# Patient Record
Sex: Female | Born: 2000 | Hispanic: Yes | Marital: Single | Smoking: Never smoker
Health system: Southern US, Community
[De-identification: ages and names within clinical notes are randomized; demographics above are authoritative.]

## PROBLEM LIST (undated history)

## (undated) DIAGNOSIS — Z789 Other specified health status: Secondary | ICD-10-CM

## (undated) HISTORY — PX: WISDOM TOOTH EXTRACTION: SHX21

---
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
## 2018-10-15 IMAGING — DX DG FINGER LITTLE 2+V*L*
3 series · 3 of 3 positions shown · non-contrast
Comparison: None.

CLINICAL DATA: Pain after fall.

EXAM:
LEFT LITTLE FINGER 2+V

[finger ap]
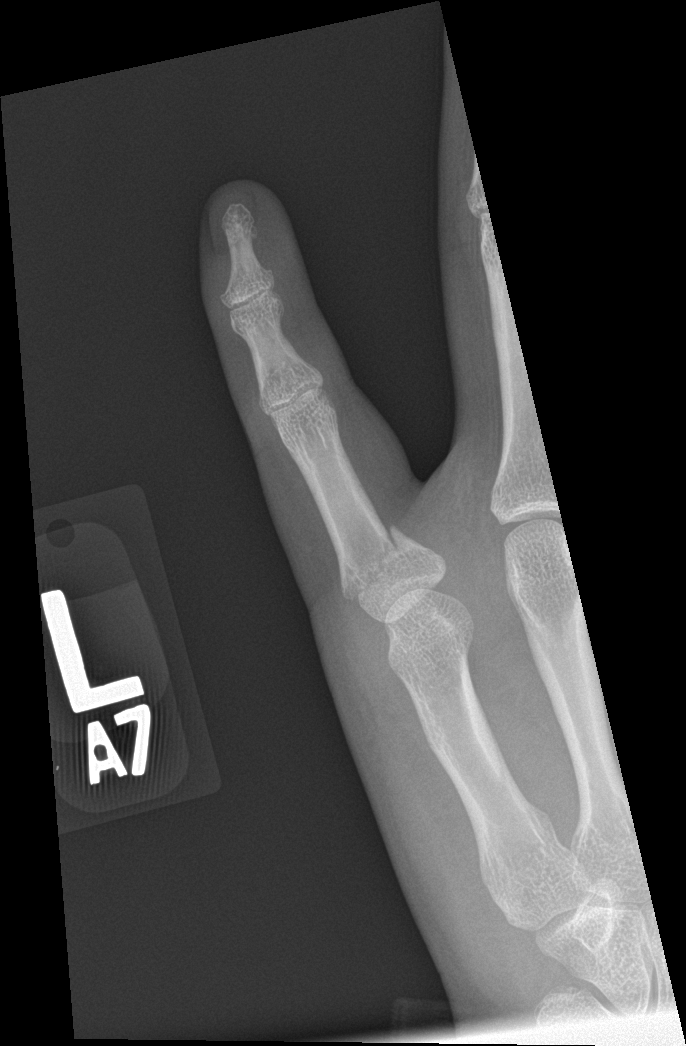

[finger obl]
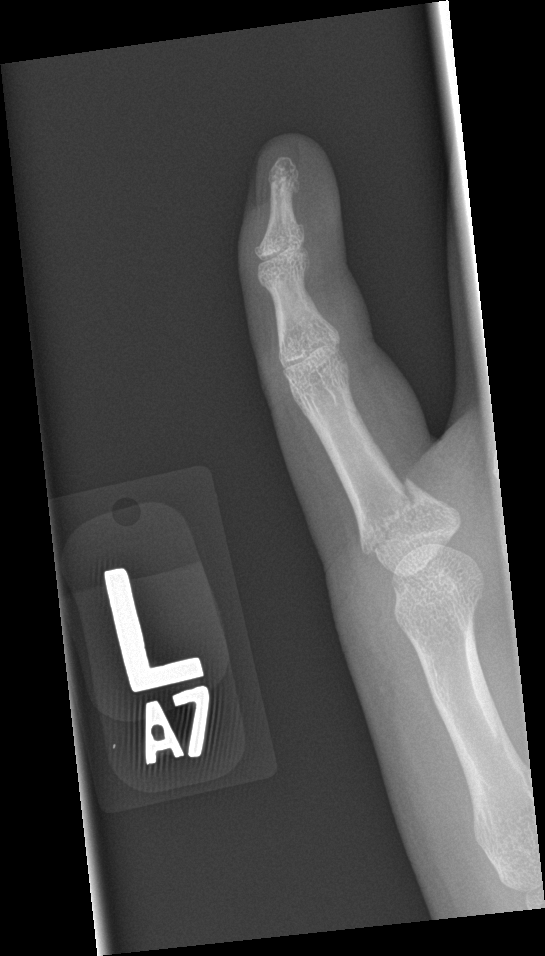

[finger lat]
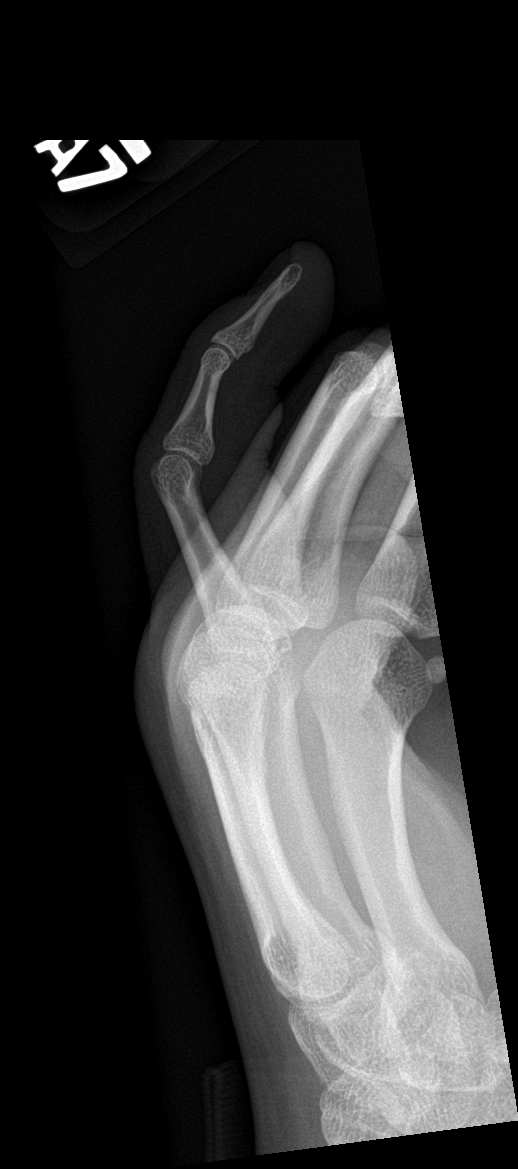

[3 of 3 positions shown; findings below may reference images not displayed]

FINDINGS: There is a fracture through the proximal aspect of the fifth
proximal phalanx with mild displacement.
IMPRESSION: Mild displaced fracture through the proximal aspect of the fifth
proximal phalanx.

## 2023-01-25 DIAGNOSIS — S8991XA Unspecified injury of right lower leg, initial encounter: Secondary | ICD-10-CM | POA: Diagnosis not present

## 2023-01-28 DIAGNOSIS — S83241A Other tear of medial meniscus, current injury, right knee, initial encounter: Secondary | ICD-10-CM | POA: Diagnosis not present

## 2023-01-31 ENCOUNTER — Other Ambulatory Visit: Payer: Self-pay | Admitting: Orthopedic Surgery

## 2023-01-31 DIAGNOSIS — S83241A Other tear of medial meniscus, current injury, right knee, initial encounter: Secondary | ICD-10-CM

## 2023-02-01 ENCOUNTER — Other Ambulatory Visit: Payer: Self-pay | Admitting: Orthopedic Surgery

## 2023-02-01 DIAGNOSIS — S83241A Other tear of medial meniscus, current injury, right knee, initial encounter: Secondary | ICD-10-CM

## 2023-02-09 ENCOUNTER — Encounter: Payer: Self-pay | Admitting: Orthopedic Surgery

## 2023-02-10 ENCOUNTER — Encounter: Payer: Self-pay | Admitting: Orthopedic Surgery

## 2023-02-11 ENCOUNTER — Other Ambulatory Visit: Payer: Medicaid Other

## 2023-02-14 ENCOUNTER — Encounter: Payer: Self-pay | Admitting: Orthopedic Surgery

## 2023-02-16 ENCOUNTER — Encounter: Payer: Self-pay | Admitting: Orthopedic Surgery

## 2023-02-22 ENCOUNTER — Ambulatory Visit
Admission: RE | Admit: 2023-02-22 | Discharge: 2023-02-22 | Disposition: A | Discharge status: Home / Self Care | Payer: 59 | Source: Ambulatory Visit | Attending: Orthopedic Surgery | Admitting: Orthopedic Surgery

## 2023-02-22 DIAGNOSIS — M25461 Effusion, right knee: Secondary | ICD-10-CM | POA: Diagnosis not present

## 2023-02-22 DIAGNOSIS — S83511A Sprain of anterior cruciate ligament of right knee, initial encounter: Secondary | ICD-10-CM | POA: Diagnosis not present

## 2023-02-22 DIAGNOSIS — S83241A Other tear of medial meniscus, current injury, right knee, initial encounter: Secondary | ICD-10-CM

## 2023-02-22 DIAGNOSIS — M25561 Pain in right knee: Secondary | ICD-10-CM | POA: Diagnosis not present

## 2023-03-16 DIAGNOSIS — S83511A Sprain of anterior cruciate ligament of right knee, initial encounter: Secondary | ICD-10-CM | POA: Diagnosis not present

## 2023-03-17 ENCOUNTER — Other Ambulatory Visit: Payer: Self-pay | Admitting: Orthopedic Surgery

## 2023-03-17 DIAGNOSIS — S83511A Sprain of anterior cruciate ligament of right knee, initial encounter: Secondary | ICD-10-CM | POA: Diagnosis not present

## 2023-03-22 ENCOUNTER — Encounter: Payer: Self-pay | Admitting: Orthopedic Surgery

## 2023-03-24 NOTE — Discharge Instructions (Addendum)
Arthroscopic ACL Surgery   Post-Op Instructions   1. Bracing or crutches: Crutches will be provided at the time of discharge from the surgery center.    2. Ice: You may be provided with a device [facility]) that allows you to ice the affected area effectively. Otherwise you can ice manually.   3. Driving:  Driving: Off all narcotic pain meds when operating vehicle   1 week for automatic cars, left leg surgery  2-4 weeks for standard/manual cars or right leg surgery   4. Activity: Ankle pumps several times an hour while awake to prevent blood clots. Weight bearing: full weight is permitted with brace locked in extension for 1 week. Then brace can be unlocked. Use crutches if there is pain and limping. Bending and straightening the knee is unlimited. Elevate knee above heart level as much as possible for one week. Avoid standing more than 5 minutes (consecutively) for the first week. No exercise involving the knee until cleared by the surgeon or physical therapist. Ideally, you should avoid long distance travel for 4 weeks.   5. Medications:  - You have been provided a prescription for narcotic pain medicine (oxycodone). After surgery, take 1-2 narcotic tablets every 4 hours if needed for severe pain.  - A prescription for anti-nausea medication (Zofran) will be provided in case the narcotic medicine causes nausea - take 1 tablet every 6 hours only if nauseated.  - Take ibuprofen 800 mg every 8 hours with food to reduce post-operative knee swelling. DO NOT STOP IBUPROFEN POST-OP UNTIL INSTRUCTED TO DO SO at first post-op office visit (10-14 days after surgery).  - Take enteric coated aspirin 325 mg once daily for 2 weeks to prevent blood clots.  -Take tylenol 1000 mg every 8 hours for pain.  May stop tylenol ~1-2 weeks after surgery if you are having minimal pain. - Take gabapentin 300 mg three times daily for 5 days - Take Valium 5mg  three times daily x 2 weeks. May stop if not having any  significant muscle spasms.    If you are taking prescription medication for anxiety, depression, insomnia, muscle spasm, chronic pain, or for attention deficit disorder you are advised that you are at a higher risk of adverse effects with use of narcotics post-op, including narcotic addiction/dependence, depressed breathing, death. If you use non-prescribed substances: alcohol, marijuana, cocaine, heroin, methamphetamines, etc., you are at a higher risk of adverse effects with use of narcotics post-op, including narcotic addiction/dependence, depressed breathing, death. You are advised that taking > 50 morphine milligram equivalents (MME) of narcotic pain medication per day results in twice the risk of overdose or death. For your prescription provided: oxycodone 5 mg - taking more than 6 tablets per day. Be advised that we will prescribe narcotics short-term, for acute post-operative pain only - 1 week for minor operations such as knee arthroscopy for meniscus tear resection, and 3 weeks for major operations such as knee repair/reconstruction surgeries.   6. Bandages: The physical therapist should change the bandages at the first post-op appointment. If needed, the dressing supplies have been provided to you.   7. Physical Therapy: 2 times per week for the first 4 weeks, then 1-2 times per week from weeks 4-8 post-op. Therapy typically starts within 1 week. You have been provided an order for physical therapy. The therapist will provide home exercises. Attending physical therapy and performing the appropriate exercises on your own at home daily will determine how well you do after this surgery. If  you do not know when your first physical therapy appointment is, please call the office at [phone].   8. Work/School: May return when able to tolerate standing for greater than 2 hours and off of narcotic pain medicaitons   9. Post-Op Appointments: Your first post-op appointment will be with Dr. Allena Katz in  approximately 2 weeks time.    If you find that they have not been scheduled please call the Orthopaedic Appointment front desk at [phone].      POLAR CARE INFORMATION  MassAdvertisement.it  How to use [employer] Polar Care [facility]?  YouTube   ShippingScam.co.uk  OPERATING INSTRUCTIONS  Start the product With dry hands, connect the transformer to the electrical connection located on the top of the cooler. Next, plug the transformer into an appropriate electrical outlet. The unit will automatically start running at this point.  To stop the pump, disconnect electrical power.  Unplug to stop the product when not in use. Unplugging the Polar Care unit turns it off. Always unplug immediately after use. Never leave it plugged in while unattended. Remove pad.    FIRST ADD WATER TO FILL LINE, THEN ICE---Replace ice when existing ice is almost melted  1 Discuss Treatment with your Licensed Health Care Practitioner and Use Only as Prescribed 2 Apply Insulation Barrier & Cold Therapy Pad 3 Check for Moisture 4 Inspect Skin Regularly  Tips and Trouble Shooting Usage Tips 1. Use cubed or chunked ice for optimal performance. 2. It is recommended to drain the Pad between uses. To drain the pad, hold the Pad upright with the hose pointed toward the ground. Depress the black plunger and allow water to drain out. 3. You may disconnect the Pad from the unit without removing the pad from the affected area by depressing the silver tabs on the hose coupling and gently pulling the hoses apart. The Pad and unit will seal itself and will not leak. Note: Some dripping during release is normal. 4. DO NOT RUN PUMP WITHOUT WATER! The pump in this unit is designed to run with water. Running the unit without water will cause permanent damage to the pump. 5. Unplug unit before removing lid.  TROUBLESHOOTING GUIDE Pump not running, Water not flowing to the pad, Pad is not  getting cold 1. Make sure the transformer is plugged into the wall outlet. 2. Confirm that the ice and water are filled to the indicated levels. 3. Make sure there are no kinks in the pad. 4. Gently pull on the blue tube to make sure the tube/pad junction is straight. 5. Remove the pad from the treatment site and ll it while the pad is lying at; then reapply. 6. Confirm that the pad couplings are securely attached to the unit. Listen for the double clicks (Figure 1) to confirm the pad couplings are securely attached.  Leaks    Note: Some condensation on the lines, controller, and pads is unavoidable, especially in warmer climates. 1. If using a [employer] Polar Care Cold Therapy unit with a detachable Cold Therapy Pad, and a leak exists (other than condensation on the lines) disconnect the pad couplings. Make sure the silver tabs on the couplings are depressed before reconnecting the pad to the pump hose; then confirm both sides of the coupling are properly clicked in. 2. If the coupling continues to leak or a leak is detected in the pad itself, stop using it and call [employer] Customer Care at [phone].  Cleaning After use, empty and dry  the unit with a soft cloth. Warm water and mild detergent may be used occasionally to clean the pump and tubes.  WARNING: The Polar Care Cube can be cold enough to cause serious injury, including full skin necrosis. Follow these Operating Instructions, and carefully read the Product Insert (see pouch on side of unit) and the Cold Therapy Pad Fitting Instructions (provided with each Cold Therapy Pad) prior to use.       PERIPHERAL NERVE BLOCK PATIENT INFORMATION  Your surgeon has requested a peripheral nerve block for your surgery. This anesthetic technique provides excellent post-operative pain relief for you in a safe and effective manner. It will also help reduce the risk of nausea and vomiting and allow earlier discharge from the hospital.   The block is  performed under sedation with ultrasound guidance prior to your procedure. Due to the sedation, your may or may not remember the block experience. The nerve block will begin to take effect anywhere from 5 to 30 minutes after being administered. You will be transported to the operating room from your surgery after the block is completed.   At the end of surgery, when the anesthesia wears off, you will notice a few things. Your may not be able to move or feel the part of your body targeted by the nerve block. These are normal experiences, and they will disappear as the block wears off.  If you had an interscalene nerve block performed (which is common for shoulder surgery), your voice can be very hoarse and you may feel that you are not able to take as deep a breath as you did before surgery. Some patients may also notice a droopy eyelid on the affected side. These symptoms will resolve once the block wears off.  Pain control: The nerve block technique used is a single injection that can last anywhere from 1-3 days. The duration of the numbness can vary between individuals. After leaving the hospital, it is important that you begin to take your prescribed pain medication when you start to sense the nerve block wearing off. This will help you avoid unpleasant pain at the time the nerve block wears off, which can sometimes be in the middle of the night. The block will only cover pain in the areas targeted by the nerve block so if you experience surgical pain outside of that area, please take your prescribed pain medication. Management of the "numb area": After a nerve block, you cannot feel pain, pressure, or temperature in the affected area so there is an increased risk for injury. You should take extra care to protect the affected areas until sensation and movement returns. Please take caution to not come in contact with extremely hot or cold items because you will not be able to sense or protect yourself form  the extremes of temperature.  You may experience some persistent numbness after the procedure by most neurological deficits resolve over time and the incidence of serious long term neurological complications attributable to peripheral nerve blocks are relatively uncommon.    Information for Discharge Teaching: EXPAREL (bupivacaine liposome injectable suspension)   Pain relief is important to your recovery. The goal is to control your pain so you can move easier and return to your normal activities as soon as possible after your procedure. Your physician may use several types of medicines to manage pain, swelling, and more.  Your surgeon or anesthesiologist gave you EXPAREL(bupivacaine) to help control your pain after surgery.  EXPAREL is a local anesthetic  designed to release slowly over an extended period of time to provide pain relief by numbing the tissue around the surgical site. EXPAREL is designed to release pain medication over time and can control pain for up to 72 hours. Depending on how you respond to EXPAREL, you may require less pain medication during your recovery. EXPAREL can help reduce or eliminate the need for opioids during the first few days after surgery when pain relief is needed the most. EXPAREL is not an opioid and is not addictive. It does not cause sleepiness or sedation.   Important! A teal colored band has been placed on your arm with the date, time and amount of EXPAREL you have received. Please leave this armband in place for the full 96 hours following administration, and then you may remove the band. If you return to the hospital for any reason within 96 hours following the administration of EXPAREL, the armband provides important information that your health care providers to know, and alerts them that you have received this anesthetic.    Possible side effects of EXPAREL: Temporary loss of sensation or ability to move in the area where medication was  injected. Nausea, vomiting, constipation Rarely, numbness and tingling in your mouth or lips, lightheadedness, or anxiety may occur. Call your doctor right away if you think you may be experiencing any of these sensations, or if you have other questions regarding possible side effects.  Follow all other discharge instructions given to you by your surgeon or nurse. Eat a healthy diet and drink plenty of water or other fluids.

## 2023-03-25 ENCOUNTER — Other Ambulatory Visit: Payer: Self-pay

## 2023-03-25 ENCOUNTER — Ambulatory Visit
Admission: RE | Admit: 2023-03-25 | Discharge: 2023-03-25 | Disposition: A | Discharge status: Home / Self Care | Payer: 59 | Attending: Orthopedic Surgery | Admitting: Orthopedic Surgery

## 2023-03-25 ENCOUNTER — Encounter: Payer: Self-pay | Admitting: Orthopedic Surgery

## 2023-03-25 ENCOUNTER — Encounter
Admission: RE | Disposition: A | Discharge status: Home / Self Care | Payer: Self-pay | Source: Home / Self Care | Attending: Orthopedic Surgery

## 2023-03-25 ENCOUNTER — Ambulatory Visit: Payer: 59 | Admitting: Anesthesiology

## 2023-03-25 DIAGNOSIS — G8918 Other acute postprocedural pain: Secondary | ICD-10-CM | POA: Diagnosis not present

## 2023-03-25 DIAGNOSIS — Z6841 Body Mass Index (BMI) 40.0 and over, adult: Secondary | ICD-10-CM | POA: Insufficient documentation

## 2023-03-25 DIAGNOSIS — S83511A Sprain of anterior cruciate ligament of right knee, initial encounter: Secondary | ICD-10-CM | POA: Insufficient documentation

## 2023-03-25 DIAGNOSIS — M94261 Chondromalacia, right knee: Secondary | ICD-10-CM | POA: Diagnosis not present

## 2023-03-25 DIAGNOSIS — X501XXA Overexertion from prolonged static or awkward postures, initial encounter: Secondary | ICD-10-CM | POA: Diagnosis not present

## 2023-03-25 HISTORY — DX: Other specified health status: Z78.9

## 2023-03-25 HISTORY — PX: ANTERIOR CRUCIATE LIGAMENT REPAIR: SHX115

## 2023-03-25 LAB — POCT PREGNANCY, URINE: Preg Test, Ur: NEGATIVE

## 2023-03-25 SURGERY — RECONSTRUCTION, KNEE, ACL
Anesthesia: General | Site: Knee | Laterality: Right

## 2023-03-25 MED ORDER — LACTATED RINGERS IR SOLN
Status: DC | PRN
  Administered 2023-03-25: 15000 mL

## 2023-03-25 MED ORDER — LIDOCAINE HCL (CARDIAC) PF 100 MG/5ML IV SOSY
PREFILLED_SYRINGE | INTRAVENOUS | Status: DC | PRN
  Administered 2023-03-25: 100 mg via INTRAVENOUS

## 2023-03-25 MED ORDER — DIAZEPAM 5 MG PO TABS: 5.0000 mg | ORAL_TABLET | Freq: Three times a day (TID) | ORAL | 0 refills | Status: AC | PRN

## 2023-03-25 MED ORDER — ACETAMINOPHEN 10 MG/ML IV SOLN
INTRAVENOUS | Status: DC | PRN
  Administered 2023-03-25: 1000 mg via INTRAVENOUS

## 2023-03-25 MED ORDER — KETOROLAC TROMETHAMINE 15 MG/ML IJ SOLN
INTRAMUSCULAR | Status: DC | PRN
Start: 2023-03-25 — End: 2023-03-25
  Administered 2023-03-25: 15 mg via INTRAVENOUS

## 2023-03-25 MED ORDER — LACTATED RINGERS IV SOLN: INTRAVENOUS | Status: DC

## 2023-03-25 MED ORDER — SUGAMMADEX SODIUM 200 MG/2ML IV SOLN
INTRAVENOUS | Status: DC | PRN
  Administered 2023-03-25: 200 mg via INTRAVENOUS

## 2023-03-25 MED ORDER — PROPOFOL 10 MG/ML IV BOLUS
INTRAVENOUS | Status: DC | PRN
  Administered 2023-03-25 (×2): 30 mg via INTRAVENOUS
  Administered 2023-03-25: 200 mg via INTRAVENOUS
  Administered 2023-03-25 (×2): 30 mg via INTRAVENOUS
  Administered 2023-03-25: 20 mg via INTRAVENOUS

## 2023-03-25 MED ORDER — DEXAMETHASONE SODIUM PHOSPHATE 10 MG/ML IJ SOLN
INTRAMUSCULAR | Status: DC | PRN
  Administered 2023-03-25: 8 mg via INTRAVENOUS

## 2023-03-25 MED ORDER — CEFAZOLIN SODIUM-DEXTROSE 2-4 GM/100ML-% IV SOLN
2.0000 g | INTRAVENOUS | Status: AC
  Administered 2023-03-25: 2 g via INTRAVENOUS

## 2023-03-25 MED ORDER — VANCOMYCIN HCL 500 MG IV SOLR
INTRAVENOUS | Status: DC | PRN
  Administered 2023-03-25: 500 mg

## 2023-03-25 MED ORDER — ONDANSETRON 4 MG PO TBDP: 4.0000 mg | ORAL_TABLET | Freq: Three times a day (TID) | ORAL | 0 refills | Status: AC | PRN

## 2023-03-25 MED ORDER — ONDANSETRON HCL 4 MG/2ML IJ SOLN
INTRAMUSCULAR | Status: DC | PRN
  Administered 2023-03-25: 4 mg via INTRAVENOUS

## 2023-03-25 MED ORDER — KETOROLAC TROMETHAMINE 30 MG/ML IJ SOLN
INTRAMUSCULAR | Status: DC | PRN
  Administered 2023-03-25: 15 mg via INTRAVENOUS

## 2023-03-25 MED ORDER — GABAPENTIN 300 MG PO CAPS: 300.0000 mg | ORAL_CAPSULE | Freq: Three times a day (TID) | ORAL | 0 refills | Status: AC

## 2023-03-25 MED ORDER — MIDAZOLAM HCL 2 MG/2ML IJ SOLN
INTRAMUSCULAR | Status: DC | PRN
  Administered 2023-03-25: 2 mg via INTRAVENOUS

## 2023-03-25 MED ORDER — ASPIRIN 325 MG PO TBEC: 325.0000 mg | DELAYED_RELEASE_TABLET | Freq: Every day | ORAL | 0 refills | Status: AC

## 2023-03-25 MED ORDER — DEXMEDETOMIDINE HCL IN NACL 200 MCG/50ML IV SOLN
INTRAVENOUS | Status: DC | PRN
Start: 2023-03-25 — End: 2023-03-25
  Administered 2023-03-25 (×2): 12 ug via INTRAVENOUS

## 2023-03-25 MED ORDER — LIDOCAINE HCL (PF) 1 % IJ SOLN
INTRAMUSCULAR | Status: DC | PRN
Start: 2023-03-25 — End: 2023-03-25
  Administered 2023-03-25: 3 mL

## 2023-03-25 MED ORDER — FENTANYL CITRATE (PF) 100 MCG/2ML IJ SOLN
INTRAMUSCULAR | Status: DC | PRN
  Administered 2023-03-25: 50 ug via INTRAVENOUS

## 2023-03-25 MED ORDER — ACETAMINOPHEN 500 MG PO TABS: 1000.0000 mg | ORAL_TABLET | Freq: Three times a day (TID) | ORAL | 2 refills | Status: AC

## 2023-03-25 MED ORDER — LIDOCAINE HCL 1 % IJ SOLN
INTRAMUSCULAR | Status: DC | PRN
Start: 2023-03-25 — End: 2023-03-25
  Administered 2023-03-25: 20 mL

## 2023-03-25 MED ORDER — IBUPROFEN 800 MG PO TABS: 800.0000 mg | ORAL_TABLET | Freq: Three times a day (TID) | ORAL | 0 refills | Status: AC

## 2023-03-25 MED ORDER — OXYCODONE HCL 5 MG PO TABS: 5.0000 mg | ORAL_TABLET | ORAL | 0 refills | Status: AC | PRN

## 2023-03-25 MED ORDER — ROCURONIUM BROMIDE 100 MG/10ML IV SOLN
INTRAVENOUS | Status: DC | PRN
  Administered 2023-03-25: 70 mg via INTRAVENOUS

## 2023-03-25 MED ORDER — BUPIVACAINE LIPOSOME 1.3 % IJ SUSP
INTRAMUSCULAR | Status: DC | PRN
Start: 2023-03-25 — End: 2023-03-25
  Administered 2023-03-25: 20 mL

## 2023-03-25 MED ORDER — OXYCODONE HCL 5 MG PO TABS
5.0000 mg | ORAL_TABLET | Freq: Once | ORAL | Status: AC
  Administered 2023-03-25: 5 mg via ORAL

## 2023-03-25 SURGICAL SUPPLY — 74 items
ADH SKN CLS APL DERMABOND .7 (GAUZE/BANDAGES/DRESSINGS) ×1
ADPR IRR PORT MULTIBAG TUBE (MISCELLANEOUS) ×2
ANCHOR BUTTON TIGHTROPE 14 (Anchor) IMPLANT
APL PRP STRL LF DISP 70% ISPRP (MISCELLANEOUS) ×1
BASIN GRAD PLASTIC 32OZ STRL (MISCELLANEOUS) ×1 IMPLANT
BLADE SHAVER 4.5X7 STR FR (MISCELLANEOUS) IMPLANT
BLADE SURG 15 STRL LF DISP TIS (BLADE) ×2 IMPLANT
BLADE SURG 15 STRL SS (BLADE) ×2
BLADE SURG SZ10 CARB STEEL (BLADE) ×1 IMPLANT
BLADE SURG SZ11 CARB STEEL (BLADE) ×1 IMPLANT
BNDG ADH 5X4 AIR PERM ELC (GAUZE/BANDAGES/DRESSINGS) ×1
BNDG COHESIVE 4X5 WHT NS (GAUZE/BANDAGES/DRESSINGS) ×1 IMPLANT
BNDG ESMARCH 6 X 12 STRL LF (GAUZE/BANDAGES/DRESSINGS) ×1
BNDG ESMARCH 6X12 STRL LF (GAUZE/BANDAGES/DRESSINGS) IMPLANT
BUTTON EXT TIGHTROPE 5X20 (Orthopedic Implant) IMPLANT
CHLORAPREP W/TINT 26 (MISCELLANEOUS) ×1 IMPLANT
COOLER POLAR GLACIER W/PUMP (MISCELLANEOUS) ×1 IMPLANT
COVER LIGHT HANDLE UNIVERSAL (MISCELLANEOUS) ×2 IMPLANT
DERMABOND ADVANCED .7 DNX12 (GAUZE/BANDAGES/DRESSINGS) ×1 IMPLANT
DEVICE SUCT BLK HOLE OR FLOOR (MISCELLANEOUS) IMPLANT
DRAPE EXTREMITY T 121X128X90 (DISPOSABLE) ×1 IMPLANT
DRAPE FLUOR MINI C-ARM 54X84 (DRAPES) ×1 IMPLANT
DRAPE IMP U-DRAPE 54X76 (DRAPES) ×1 IMPLANT
DRAPE SHEET LG 3/4 BI-LAMINATE (DRAPES) ×1 IMPLANT
DRAPE TABLE BACK 80X90 (DRAPES) ×1 IMPLANT
ELECT REM PT RETURN 9FT ADLT (ELECTROSURGICAL) ×1
ELECTRODE REM PT RTRN 9FT ADLT (ELECTROSURGICAL) ×1 IMPLANT
GAUZE SPONGE 4X4 12PLY STRL (GAUZE/BANDAGES/DRESSINGS) ×1 IMPLANT
GLOVE SRG 8 PF TXTR STRL LF DI (GLOVE) ×3 IMPLANT
GLOVE SURG ENC MOIS LTX SZ7.5 (GLOVE) ×5 IMPLANT
GLOVE SURG UNDER POLY LF SZ8 (GLOVE) ×5
GOWN STRL REIN 2XL XLG LVL4 (GOWN DISPOSABLE) ×1 IMPLANT
GOWN STRL REUS W/ TWL LRG LVL3 (GOWN DISPOSABLE) ×2 IMPLANT
GOWN STRL REUS W/TWL LRG LVL3 (GOWN DISPOSABLE) ×3
IMP SYS 2ND FIX PEEK 4.75X19.1 (Miscellaneous) ×1 IMPLANT
IMPL QUADLINK SYSTEM 10 (Orthopedic Implant) IMPLANT
IMPL SYS 2ND FX PEEK 4.75X19.1 (Miscellaneous) IMPLANT
IMPLANT QUADLINK SYSTEM 10 (Orthopedic Implant) ×1 IMPLANT
IV LACTATED RINGER IRRG 3000ML (IV SOLUTION) ×4
IV LR IRRIG 3000ML ARTHROMATIC (IV SOLUTION) ×8 IMPLANT
KIT ACL DISPOSABLE (KITS) IMPLANT
KIT TURNOVER KIT A (KITS) ×1 IMPLANT
MANIFOLD NEPTUNE II (INSTRUMENTS) ×2 IMPLANT
MAT ABSORB FLUID 56X50 GRAY (MISCELLANEOUS) ×3 IMPLANT
NDL SUT 2-0 SCORPION KNEE (NEEDLE) IMPLANT
NEEDLE SUT 2-0 SCORPION KNEE (NEEDLE) ×1
NS IRRIG 500ML POUR BTL (IV SOLUTION) ×1 IMPLANT
PACK ARTHROSCOPY KNEE (MISCELLANEOUS) ×1 IMPLANT
PAD ABD DERMACEA PRESS 5X9 (GAUZE/BANDAGES/DRESSINGS) ×2 IMPLANT
PAD WRAPON POLAR KNEE (MISCELLANEOUS) ×1 IMPLANT
PADDING CAST BLEND 6X4 STRL (MISCELLANEOUS) ×2 IMPLANT
PENCIL SMOKE EVACUATOR (MISCELLANEOUS) ×1 IMPLANT
REAMER LOW PROFILE 10.5 (INSTRUMENTS) IMPLANT
SET Y ADAPTER MULIT-BAG IRRIG (MISCELLANEOUS) ×2 IMPLANT
SPONGE T-LAP 18X18 ~~LOC~~+RFID (SPONGE) ×2 IMPLANT
STRIPPER BLADE TENDON 9 (BLADE) IMPLANT
SUT ETHILON 3-0 FS-10 30 BLK (SUTURE) ×1
SUT FIBERWIRE #2 38 T-5 BLUE (SUTURE) ×1
SUT MNCRL 4-0 (SUTURE) ×3
SUT MNCRL 4-0 27XMFL (SUTURE) ×3
SUT VIC AB 0 CT1 36 (SUTURE) ×2 IMPLANT
SUT VIC AB 2-0 CT2 27 (SUTURE) ×2 IMPLANT
SUTURE EHLN 3-0 FS-10 30 BLK (SUTURE) ×1 IMPLANT
SUTURE FIBERWR #2 38 T-5 BLUE (SUTURE) IMPLANT
SUTURE MNCRL 4-0 27XMF (SUTURE) ×1 IMPLANT
SUTURE TAPE FIBERLINK 1.3 LOOP (SUTURE) IMPLANT
SUTURETAPE FIBERLINK 1.3 LOOP (SUTURE) ×1
SYR BULB IRRIG 60ML STRL (SYRINGE) ×1 IMPLANT
TAPE LABRALWHITE 1.5X36 (TAPE) IMPLANT
TOWEL OR 17X26 4PK STRL BLUE (TOWEL DISPOSABLE) ×2 IMPLANT
TUBING INFLOW SET DBFLO PUMP (TUBING) ×1 IMPLANT
TUBING OUTFLOW SET DBLFO PUMP (TUBING) ×1 IMPLANT
WAND WEREWOLF FLOW 90D (MISCELLANEOUS) ×1 IMPLANT
WRAPON POLAR PAD KNEE (MISCELLANEOUS) ×1

## 2023-03-25 NOTE — Anesthesia Preprocedure Evaluation (Signed)
Anesthesia Evaluation  Patient identified by MRN, date of birth, ID band Patient awake    Reviewed: Allergy & Precautions, NPO status , Patient's Chart, lab work & pertinent test results  History of Anesthesia Complications Negative for: history of anesthetic complications  Airway Mallampati: III  TM Distance: >3 FB Neck ROM: full    Dental no notable dental hx.    Pulmonary neg pulmonary ROS   Pulmonary exam normal        Cardiovascular negative cardio ROS Normal cardiovascular exam     Neuro/Psych negative neurological ROS  negative psych ROS   GI/Hepatic negative GI ROS, Neg liver ROS,,,  Endo/Other    Morbid obesity  Renal/GU negative Renal ROS  negative genitourinary   Musculoskeletal   Abdominal   Peds  Hematology negative hematology ROS (+)   Anesthesia Other Findings Past Medical History: No date: Medical history non-contributory  Past Surgical History: No date: WISDOM TOOTH EXTRACTION  BMI    Body Mass Index: 40.23 kg/m      Reproductive/Obstetrics negative OB ROS                             Anesthesia Physical Anesthesia Plan  ASA: 3  Anesthesia Plan: General   Post-op Pain Management: Regional block, Toradol IV (intra-op), Fentanyl IV and Ofirmev IV (intra-op)   Induction: Intravenous  PONV Risk Score and Plan: 3 and Ondansetron, Dexamethasone, Midazolam and Treatment may vary due to age or medical condition  Airway Management Planned: Oral ETT  Additional Equipment:   Intra-op Plan:   Post-operative Plan: Extubation in OR  Informed Consent: I have reviewed the patients History and Physical, chart, labs and discussed the procedure including the risks, benefits and alternatives for the proposed anesthesia with the patient or authorized representative who has indicated his/her understanding and acceptance.     Dental Advisory Given  Plan Discussed  with: Anesthesiologist, CRNA and Surgeon  Anesthesia Plan Comments: (Patient consented for risks of anesthesia including but not limited to:  - adverse reactions to medications - damage to eyes, teeth, lips or other oral mucosa - nerve damage due to positioning  - sore throat or hoarseness - Damage to heart, brain, nerves, lungs, other parts of body or loss of life  Patient voiced understanding.)       Anesthesia Quick Evaluation

## 2023-03-25 NOTE — Anesthesia Procedure Notes (Addendum)
Procedure Name: Intubation Date/Time: 03/25/2023 7:50 AM  Performed by: Genia Del, CRNAPre-anesthesia Checklist: Patient identified, Patient being monitored, Timeout performed, Emergency Drugs available and Suction available Patient Re-evaluated:Patient Re-evaluated prior to induction Oxygen Delivery Method: Circle system utilized Preoxygenation: Pre-oxygenation with 100% oxygen Induction Type: IV induction Ventilation: Mask ventilation without difficulty Laryngoscope Size: McGraph and 4 Grade View: Grade II Tube type: Oral Tube size: 7.0 mm Number of attempts: 1 Airway Equipment and Method: Stylet Placement Confirmation: ETT inserted through vocal cords under direct vision, positive ETCO2 and breath sounds checked- equal and bilateral Secured at: 22 cm Tube secured with: Tape Dental Injury: Teeth and Oropharynx as per pre-operative assessment  Comments: Morbid obesity.   Cricoid pressure by MDA.  Small TV breath with easy mask AW.   Redundant aw tissue with thick neck and slightly anterior,  but easy rapid ETT placement.  18 Fr OGT placed to CLWS.

## 2023-03-25 NOTE — Anesthesia Postprocedure Evaluation (Signed)
Anesthesia Post Note  Patient: JENNIS THORNBERG  Procedure(s) Performed: RIGHT arthroscopic anterior cruciate ligament reconstruction using quadriceps tendon (Right: Knee)  Patient location during evaluation: PACU Anesthesia Type: General and Regional Level of consciousness: awake and alert Pain management: pain level controlled Vital Signs Assessment: post-procedure vital signs reviewed and stable Respiratory status: spontaneous breathing, nonlabored ventilation, respiratory function stable and patient connected to nasal cannula oxygen Cardiovascular status: blood pressure returned to baseline and stable Postop Assessment: no apparent nausea or vomiting Anesthetic complications: no   No notable events documented.   Last Vitals:  Vitals:   03/25/23 1035 03/25/23 1040  BP:    Pulse: 85 76  Resp: (!) 29 (!) 23  Temp:    SpO2: 97% 95%    Last Pain:  Vitals:   03/25/23 1030  TempSrc:   PainSc: 7                  Louie Boston

## 2023-03-25 NOTE — Transfer of Care (Signed)
Immediate Anesthesia Transfer of Care Note  Patient: Shari Norris  Procedure(s) Performed: RIGHT arthroscopic anterior cruciate ligament reconstruction using quadriceps tendon (Right: Knee)  Patient Location: PACU  Anesthesia Type:General  Level of Consciousness: awake  Airway & Oxygen Therapy: Patient Spontanous Breathing and Patient connected to face mask oxygen  Post-op Assessment: Report given to RN and Post -op Vital signs reviewed and stable  Post vital signs: Reviewed and stable  Last Vitals: BP and temp wnl  See flow sheet. Vitals Value Taken Time  BP    Temp    Pulse 83 03/25/23 1021  Resp 20 03/25/23 1021  SpO2 99 % 03/25/23 1021  Vitals shown include unfiled device data.  Last Pain:  Vitals:   03/25/23 0649  TempSrc: Temporal  PainSc: 0-No pain         Complications: No notable events documented.

## 2023-03-25 NOTE — Progress Notes (Signed)
Assisted Joelene Millin ANMD with right, adductor canal, ultrasound guided block. Side rails up, monitors on throughout procedure. See vital signs in flow sheet. Tolerated Procedure well.

## 2023-03-25 NOTE — Anesthesia Procedure Notes (Signed)
Anesthesia Regional Block: Adductor canal block   Pre-Anesthetic Checklist: , timeout performed,  Correct Patient, Correct Site, Correct Laterality,  Correct Procedure, Correct Position, site marked,  Risks and benefits discussed,  Surgical consent,  Pre-op evaluation,  At surgeon's request and post-op pain management  Laterality: Lower and Right  Prep: chloraprep       Needles:  Injection technique: Single-shot  Needle Type: Echogenic Needle     Needle Length: 9cm  Needle Gauge: 21     Additional Needles:   Procedures:,,,, ultrasound used (permanent image in chart),,    Narrative:  Start time: 03/25/2023 10:28 AM End time: 03/25/2023 10:30 AM Injection made incrementally with aspirations every 5 mL.  Performed by: Personally  Anesthesiologist: Louie Boston, MD  Additional Notes: Patient's chart reviewed and they were deemed appropriate candidate for procedure, per surgeon's request. Patient educated about risks, benefits, and alternatives of the block including but not limited to: temporary or permanent nerve damage, bleeding, infection, damage to surround tissues, block failure, local anesthetic toxicity. Patient expressed understanding. A formal time-out was conducted consistent with institution rules.  Monitors were applied, and minimal sedation used (see nursing record). The site was prepped with skin prep and allowed to dry, and sterile gloves were used. A high frequency linear ultrasound probe with probe cover was utilized throughout. Femoral artery visualized at mid-thigh level, local anesthetic injected anterolateral to it, and echogenic block needle trajectory was monitored throughout. Hydrodissection of saphenous nerve visualized and appeared anatomically normal. Aspiration performed every 5ml. Blood vessels were avoided. All injections were performed without resistance and free of blood and paresthesias. The patient tolerated the procedure well.  Injectate: 20cc Exparel

## 2023-03-25 NOTE — Op Note (Signed)
Operative Note    SURGERY DATE: 03/25/2023   PRE-OP DIAGNOSIS:  1. Right knee anterior cruciate ligament tear   POST-OP DIAGNOSIS:  1. Right knee anterior cruciate ligament tear 2. Right knee lateral compartment degenerative changes  PROCEDURES:  1. Right knee anterior cruciate ligament reconstruction with quadriceps tendon autograft 2. Right knee chondroplasty of lateral tibial plateau   SURGEON: Rosealee Albee, MD  ASSISTANT: Dedra Skeens, PA   ANESTHESIA: Gen + regional adductor canal w/Exparel   ESTIMATED BLOOD LOSS: 25cc   TOTAL IV FLUIDS: per anesthesia  INDICATION(S):  Shari Norris is a 22 y.o. female who sustained a soccer injury to her right knee. MRI showed an ACL tear.  We discussed risks of surgery including but not limited to possible ACL and/or meniscus re-tear, infection, bleeding, muscle/nerve damage, DVT, complications of anesthesia, and postoperative knee pain and arthrofibrosis. After discussion of risks, benefits, and alternatives to surgery, the patient and family elected to proceed.   OPERATIVE FINDINGS:    Examination under anesthesia: A careful examination under anesthesia was performed.  Passive range of motion was: Hyperextension: 2.  Extension: 0.  Flexion: 125.  Lachman: 2A. Pivot Shift: grade 1.  Posterior drawer: normal.  Varus stability in full extension: normal.  Varus stability in 30 degrees of flexion: normal.  Valgus stability in full extension: normal.  Valgus stability in 30 degrees of flexion: normal.   Intra-operative findings: A thorough arthroscopic examination of the knee was performed.  The findings are: 1. Suprapatellar pouch: Normal 2. Undersurface of median ridge: Normal 3. Medial patellar facet: Normal 4. Lateral patellar facet: Normal 5. Trochlea: Normal 6. Lateral gutter/popliteus tendon: Normal 7. Hoffa's fat pad: Normal 8. Medial gutter/plica: Normal 9. ACL: Abnormal: Intact tibial fibers with some attachments of the femoral  fibers.  There was significant disruption of the femoral attachment not consistent with just a single bundle tear 10. PCL: Normal 11. Medial meniscus: Normal 12. Medial compartment cartilage: Normal 13. Lateral meniscus: Normal 14. Lateral compartment cartilage: Grade 2-3 degenerative changes to the tibial plateau adjacent to the tibial spine   OPERATIVE REPORT:    I identified Shari Norris in the pre-operative holding area.  I marked the operative knee with my initials. I reviewed the risks and benefits of the proposed surgical intervention and the patient (and/or patient's guardian) wished to proceed. The patient was transferred to the operative suite and placed in the supine position with all bony prominences padded. Care was taken to ensure that the contralateral leg was placed in neutral position and that the operative leg was well-padded in the leg holder.     Appropriate IV antibiotics were administered within 30 minutes of incision. The extremity was then prepped and draped in standard fashion. A time out was performed confirming the correct extremity, correct patient and correct procedure.   Given the examination under anesthesia, we elected to begin with the arthroscopic portion of the procedure.  The right lower extremity was exsanguinated with an Esmarch, and a thigh tourniquet was elevated to 250 mmHg.  The total tourniquet was let down after skin closure, and total tourniquet time was 120 minutes.   Standard anterolateral portals was created with an 11-blade.  The arthroscope was introduced through the anterolateral portal, and a full diagnostic arthroscopy was performed as described above.   An anteromedial portal was made under needle localization. A shaver was introduced through the anteromedial portal and used to gently debride the fat pad to improve visualization.  Given  the diminutive appearance of the femoral attachment of the ACL and the patient's desired activity level, we  elected to proceed with performing ACL reconstruction.  Next, we performed the quadriceps autograft harvest.    A 5cm incision was planned just proximal to the proximal pole of the patella.  The incision was made with a 15 blade, and subcutaneous fat was sharply excised to expose the quadriceps tendon.  The paratenon was sharply incised, and the space in between the paratenon and the quadriceps tendon was bluntly developed with a sponge and a key elevator.  A speculum retractor was placed anteriorly, and the quadriceps was easily visualized with the arthroscope.  The vastus lateralis and VMO were clearly identified, as was the junction of the rectus femoris muscle with the proximal aspect of the quadriceps tendon.  Under direct visualization with the arthroscope, an Arthrex 9mm parallel blade was used to incise the quadriceps tendon from its most proximal extent, to the junction with the patella.  Care was taken not to violate the rectus femoris muscle.  Then, using a 15 blade, the graft was transected and elevated distally off the patella.  She had a relatively thin quadriceps tendon so this ended up being a full-thickness harvest. The distal end of the graft was controlled with a #2 Fiberwire stitch, and the Arthrex quadriceps harvester/cutter was loaded over the graft.  At a length of 65mm, the harvest/cutter was used to transect the graft proximally, and the graft was removed from the wound.  Side-to-side stitches were placed in the quadriceps tendon along the entirety of the harvest site with 0 Vicryl.   On the back table, the graft was prepared in standard fashion.  The length of the graft was 65 mm.  Each end was prepared using an Warden/ranger.  The femoral end was secured around a TightRope RT, and the tibial end was secured around an ABS loop.  Additionally a FiberTape was placed through the femoral button to serve as an internal brace.  The femoral end of the graft was 9.37mm in diameter,  the tibial end was 10.69mm in diameter. The graft was tensioned to 20 lbs and reserved for later use.  Of note, it was soaked in 5 mg/mL vancomycin solution to reduce risk of infection for at least 20 minutes prior to implantation.   The arthroscope was placed back into the joint.  Then the ACL remnant was debrided using the shaver, leaving 1-2 mm stumps on the tibia for anatomic referencing. Next, I created the femoral socket. This was performed with an outside-in technique using an Teacher, English as a foreign language. The retrograde femoral guide was utilized. We made the lateral stab incision with a 15 blade and followed the angle of the drill sleeve to make the incision through the IT band down to bone. The drill sleeve was pushed down to bone on the lateral femoral condyle and the guide was placed on the anatomic footprint of the ACL. A 9.33mm tunnel 24mm in length was drilled. We then used a FiberStick to pass a suture through the femoral tunnel and out of the anteromedial portal.    I then directed my attention to preparation of the tibial tunnel. A tibial guide set at 60 degrees was inserted through the anteromedial portal and centered over the tibial footprint.  The drill sleeve was then advanced to the proximal medial tibia just at the junction of the tibial tubercle and the pes tendon, through a ~5cm incision.  The anticipated tunnel length  was 40mm.  A guide pin was then drilled through the proximal tibia under direct arthroscopic visualization into the center of the ACL footprint.  This was then over-reamed with an Arthrex 10.58mm reamer. Bony debris and soft tissue was cleared from the metaphyseal opening with electrocautery and from the intra-articular aperture with a shaver.     The passing suture was then brought out of the tibial tunnel.  The graft was then advanced into place in standard fashion.  The femoral Tight Rope was deployed on the lateral cortex under direct arthroscopic visualization from the  anteromedial portal.  Correct position on the lateral cortex was confirmed fluoroscopically.  The TightRope was then shortened until at least 20 mm of graft was in the femoral tunnel.     I then directed my attention to tibial fixation.  This was performed with the knee in full extension with an axial and posterior drawer load applied to the tibia.  A 14mm ABS concave button was loaded over the ABS loop, and the loop was shortened until the button was flush with the anteromedial tibial cortex. The knee was then cycled 20 times, and both the femoral and tibial button were tightened as much as possible with the knee in full extension. The tibial sutures were tightened, and a knot was placed over the button.  A hole for a 4.75 mm SwiveLock was drilled approximately 2 cm distal to the tibial tunnel.  The ends of the tibial sutures and FiberTape for internal brace were placed under tension and the anchor was advanced.  This served as a back-up tibial fixation.   A repeat examination under anesthesia was performed.  The patient retained full hyperextension and flexion. The Lachman's was normalized. The arthroscope was re-introduced into the knee joint, confirming excellent position and tension of the quadriceps autograft. There was no lateral wall or roof impingement. Tibial and femoral sutures were cut.   The wounds were irrigated. 2-0 Vicryl was used to close the subdermal layers of the quadriceps tendon harvest, distal lateral femoral tunnel, and the tibial tunnel incisions.  The skin of these incisions was then closed with 4-0 Monocryl and Dermabond.  The arthroscopy portals were closed with 3-0 Nylon.  A sterile dressing was applied, followed by a Polar Care device and a hinged knee brace locked in full extension.   The patient was awakened from anesthesia without difficulty and was transferred to the PACU in stable condition.   Of note, assistance from a PA was essential to performing the surgery.  PA was  present for the entire surgery.  PA assisted with patient positioning, graft preparation, retraction, instrumentation, and wound closure. The surgery would have been more difficult and had longer operative time without PA assistance.    POSTOPERATIVE PLAN: The patient will be discharged home today once they meet PACU criteria. WBAT with hinged knee brace locked in extension.  Aspirin 325 mg daily for 2 weeks for DVT prophylaxis. Start physical therapy on POD#3-4. Follow up in 2 weeks per protocol.

## 2023-03-25 NOTE — H&P (Signed)
Paper H&P to be scanned into permanent record. H&P reviewed. No significant changes noted.  

## 2023-03-26 ENCOUNTER — Encounter: Payer: Self-pay | Admitting: Orthopedic Surgery

## 2023-03-29 DIAGNOSIS — S83511A Sprain of anterior cruciate ligament of right knee, initial encounter: Secondary | ICD-10-CM | POA: Diagnosis not present

## 2023-03-29 DIAGNOSIS — G8929 Other chronic pain: Secondary | ICD-10-CM | POA: Diagnosis not present

## 2023-03-29 DIAGNOSIS — M25661 Stiffness of right knee, not elsewhere classified: Secondary | ICD-10-CM | POA: Diagnosis not present

## 2023-03-29 DIAGNOSIS — R29898 Other symptoms and signs involving the musculoskeletal system: Secondary | ICD-10-CM | POA: Diagnosis not present

## 2023-03-29 DIAGNOSIS — M25561 Pain in right knee: Secondary | ICD-10-CM | POA: Diagnosis not present

## 2023-04-05 DIAGNOSIS — M25561 Pain in right knee: Secondary | ICD-10-CM | POA: Diagnosis not present

## 2023-04-05 DIAGNOSIS — R29898 Other symptoms and signs involving the musculoskeletal system: Secondary | ICD-10-CM | POA: Diagnosis not present

## 2023-04-05 DIAGNOSIS — G8929 Other chronic pain: Secondary | ICD-10-CM | POA: Diagnosis not present

## 2023-04-05 DIAGNOSIS — M25661 Stiffness of right knee, not elsewhere classified: Secondary | ICD-10-CM | POA: Diagnosis not present

## 2023-04-05 DIAGNOSIS — S83511A Sprain of anterior cruciate ligament of right knee, initial encounter: Secondary | ICD-10-CM | POA: Diagnosis not present

## 2023-04-13 DIAGNOSIS — G8929 Other chronic pain: Secondary | ICD-10-CM | POA: Diagnosis not present

## 2023-04-13 DIAGNOSIS — M25661 Stiffness of right knee, not elsewhere classified: Secondary | ICD-10-CM | POA: Diagnosis not present

## 2023-04-13 DIAGNOSIS — R29898 Other symptoms and signs involving the musculoskeletal system: Secondary | ICD-10-CM | POA: Diagnosis not present

## 2023-04-13 DIAGNOSIS — S83511A Sprain of anterior cruciate ligament of right knee, initial encounter: Secondary | ICD-10-CM | POA: Diagnosis not present

## 2023-04-13 DIAGNOSIS — M25561 Pain in right knee: Secondary | ICD-10-CM | POA: Diagnosis not present

## 2023-04-19 DIAGNOSIS — M25561 Pain in right knee: Secondary | ICD-10-CM | POA: Diagnosis not present

## 2023-04-19 DIAGNOSIS — M25661 Stiffness of right knee, not elsewhere classified: Secondary | ICD-10-CM | POA: Diagnosis not present

## 2023-04-19 DIAGNOSIS — S83511A Sprain of anterior cruciate ligament of right knee, initial encounter: Secondary | ICD-10-CM | POA: Diagnosis not present

## 2023-04-19 DIAGNOSIS — R29898 Other symptoms and signs involving the musculoskeletal system: Secondary | ICD-10-CM | POA: Diagnosis not present

## 2023-04-19 DIAGNOSIS — G8929 Other chronic pain: Secondary | ICD-10-CM | POA: Diagnosis not present

## 2023-04-21 DIAGNOSIS — S83511A Sprain of anterior cruciate ligament of right knee, initial encounter: Secondary | ICD-10-CM | POA: Diagnosis not present

## 2023-04-29 DIAGNOSIS — S83511A Sprain of anterior cruciate ligament of right knee, initial encounter: Secondary | ICD-10-CM | POA: Diagnosis not present

## 2023-05-02 DIAGNOSIS — M25561 Pain in right knee: Secondary | ICD-10-CM | POA: Diagnosis not present

## 2023-05-02 DIAGNOSIS — S83511A Sprain of anterior cruciate ligament of right knee, initial encounter: Secondary | ICD-10-CM | POA: Diagnosis not present

## 2023-05-02 DIAGNOSIS — G8929 Other chronic pain: Secondary | ICD-10-CM | POA: Diagnosis not present

## 2023-05-02 DIAGNOSIS — R29898 Other symptoms and signs involving the musculoskeletal system: Secondary | ICD-10-CM | POA: Diagnosis not present

## 2023-05-02 DIAGNOSIS — M25661 Stiffness of right knee, not elsewhere classified: Secondary | ICD-10-CM | POA: Diagnosis not present

## 2023-05-04 DIAGNOSIS — S83511D Sprain of anterior cruciate ligament of right knee, subsequent encounter: Secondary | ICD-10-CM | POA: Diagnosis not present

## 2023-05-10 DIAGNOSIS — M25661 Stiffness of right knee, not elsewhere classified: Secondary | ICD-10-CM | POA: Diagnosis not present

## 2023-05-10 DIAGNOSIS — G8929 Other chronic pain: Secondary | ICD-10-CM | POA: Diagnosis not present

## 2023-05-10 DIAGNOSIS — S83511A Sprain of anterior cruciate ligament of right knee, initial encounter: Secondary | ICD-10-CM | POA: Diagnosis not present

## 2023-05-10 DIAGNOSIS — R29898 Other symptoms and signs involving the musculoskeletal system: Secondary | ICD-10-CM | POA: Diagnosis not present

## 2023-05-10 DIAGNOSIS — M25561 Pain in right knee: Secondary | ICD-10-CM | POA: Diagnosis not present

## 2023-05-12 DIAGNOSIS — S83511A Sprain of anterior cruciate ligament of right knee, initial encounter: Secondary | ICD-10-CM | POA: Diagnosis not present

## 2023-05-12 DIAGNOSIS — G8929 Other chronic pain: Secondary | ICD-10-CM | POA: Diagnosis not present

## 2023-05-12 DIAGNOSIS — M25561 Pain in right knee: Secondary | ICD-10-CM | POA: Diagnosis not present

## 2023-05-12 DIAGNOSIS — M25661 Stiffness of right knee, not elsewhere classified: Secondary | ICD-10-CM | POA: Diagnosis not present

## 2023-05-12 DIAGNOSIS — R29898 Other symptoms and signs involving the musculoskeletal system: Secondary | ICD-10-CM | POA: Diagnosis not present

## 2023-05-18 DIAGNOSIS — S83511A Sprain of anterior cruciate ligament of right knee, initial encounter: Secondary | ICD-10-CM | POA: Diagnosis not present

## 2023-05-25 DIAGNOSIS — S83511A Sprain of anterior cruciate ligament of right knee, initial encounter: Secondary | ICD-10-CM | POA: Diagnosis not present

## 2023-05-27 DIAGNOSIS — R29898 Other symptoms and signs involving the musculoskeletal system: Secondary | ICD-10-CM | POA: Diagnosis not present

## 2023-05-27 DIAGNOSIS — G8929 Other chronic pain: Secondary | ICD-10-CM | POA: Diagnosis not present

## 2023-05-27 DIAGNOSIS — M25561 Pain in right knee: Secondary | ICD-10-CM | POA: Diagnosis not present

## 2023-05-27 DIAGNOSIS — S83511A Sprain of anterior cruciate ligament of right knee, initial encounter: Secondary | ICD-10-CM | POA: Diagnosis not present

## 2023-05-27 DIAGNOSIS — M25661 Stiffness of right knee, not elsewhere classified: Secondary | ICD-10-CM | POA: Diagnosis not present

## 2023-06-01 DIAGNOSIS — S83511A Sprain of anterior cruciate ligament of right knee, initial encounter: Secondary | ICD-10-CM | POA: Diagnosis not present

## 2023-06-03 DIAGNOSIS — M25661 Stiffness of right knee, not elsewhere classified: Secondary | ICD-10-CM | POA: Diagnosis not present

## 2023-06-03 DIAGNOSIS — R269 Unspecified abnormalities of gait and mobility: Secondary | ICD-10-CM | POA: Diagnosis not present

## 2023-06-03 DIAGNOSIS — S83511A Sprain of anterior cruciate ligament of right knee, initial encounter: Secondary | ICD-10-CM | POA: Diagnosis not present

## 2023-06-03 DIAGNOSIS — M25561 Pain in right knee: Secondary | ICD-10-CM | POA: Diagnosis not present

## 2023-06-03 DIAGNOSIS — G8929 Other chronic pain: Secondary | ICD-10-CM | POA: Diagnosis not present

## 2023-06-10 DIAGNOSIS — S83511A Sprain of anterior cruciate ligament of right knee, initial encounter: Secondary | ICD-10-CM | POA: Diagnosis not present

## 2023-06-13 DIAGNOSIS — R29898 Other symptoms and signs involving the musculoskeletal system: Secondary | ICD-10-CM | POA: Diagnosis not present

## 2023-06-13 DIAGNOSIS — G8929 Other chronic pain: Secondary | ICD-10-CM | POA: Diagnosis not present

## 2023-06-13 DIAGNOSIS — M25561 Pain in right knee: Secondary | ICD-10-CM | POA: Diagnosis not present

## 2023-06-13 DIAGNOSIS — S83511A Sprain of anterior cruciate ligament of right knee, initial encounter: Secondary | ICD-10-CM | POA: Diagnosis not present

## 2023-06-13 DIAGNOSIS — M25661 Stiffness of right knee, not elsewhere classified: Secondary | ICD-10-CM | POA: Diagnosis not present

## 2023-06-15 DIAGNOSIS — S83511A Sprain of anterior cruciate ligament of right knee, initial encounter: Secondary | ICD-10-CM | POA: Diagnosis not present

## 2023-06-21 DIAGNOSIS — S83511A Sprain of anterior cruciate ligament of right knee, initial encounter: Secondary | ICD-10-CM | POA: Diagnosis not present

## 2023-06-23 DIAGNOSIS — S83511A Sprain of anterior cruciate ligament of right knee, initial encounter: Secondary | ICD-10-CM | POA: Diagnosis not present

## 2023-06-28 DIAGNOSIS — S83511A Sprain of anterior cruciate ligament of right knee, initial encounter: Secondary | ICD-10-CM | POA: Diagnosis not present

## 2023-06-30 DIAGNOSIS — S83511A Sprain of anterior cruciate ligament of right knee, initial encounter: Secondary | ICD-10-CM | POA: Diagnosis not present

## 2023-07-05 DIAGNOSIS — S83511A Sprain of anterior cruciate ligament of right knee, initial encounter: Secondary | ICD-10-CM | POA: Diagnosis not present

## 2023-07-06 DIAGNOSIS — S83511D Sprain of anterior cruciate ligament of right knee, subsequent encounter: Secondary | ICD-10-CM | POA: Diagnosis not present

## 2023-07-08 DIAGNOSIS — R29898 Other symptoms and signs involving the musculoskeletal system: Secondary | ICD-10-CM | POA: Diagnosis not present

## 2023-07-08 DIAGNOSIS — G8929 Other chronic pain: Secondary | ICD-10-CM | POA: Diagnosis not present

## 2023-07-08 DIAGNOSIS — M25561 Pain in right knee: Secondary | ICD-10-CM | POA: Diagnosis not present

## 2023-07-08 DIAGNOSIS — M25661 Stiffness of right knee, not elsewhere classified: Secondary | ICD-10-CM | POA: Diagnosis not present

## 2023-07-08 DIAGNOSIS — S83511A Sprain of anterior cruciate ligament of right knee, initial encounter: Secondary | ICD-10-CM | POA: Diagnosis not present

## 2023-07-12 DIAGNOSIS — G8929 Other chronic pain: Secondary | ICD-10-CM | POA: Diagnosis not present

## 2023-07-12 DIAGNOSIS — M25661 Stiffness of right knee, not elsewhere classified: Secondary | ICD-10-CM | POA: Diagnosis not present

## 2023-07-12 DIAGNOSIS — M25561 Pain in right knee: Secondary | ICD-10-CM | POA: Diagnosis not present

## 2023-07-12 DIAGNOSIS — R29898 Other symptoms and signs involving the musculoskeletal system: Secondary | ICD-10-CM | POA: Diagnosis not present

## 2023-07-12 DIAGNOSIS — S83511A Sprain of anterior cruciate ligament of right knee, initial encounter: Secondary | ICD-10-CM | POA: Diagnosis not present

## 2023-07-15 DIAGNOSIS — S83511A Sprain of anterior cruciate ligament of right knee, initial encounter: Secondary | ICD-10-CM | POA: Diagnosis not present

## 2023-07-15 DIAGNOSIS — G8929 Other chronic pain: Secondary | ICD-10-CM | POA: Diagnosis not present

## 2023-07-15 DIAGNOSIS — R29898 Other symptoms and signs involving the musculoskeletal system: Secondary | ICD-10-CM | POA: Diagnosis not present

## 2023-07-15 DIAGNOSIS — M25661 Stiffness of right knee, not elsewhere classified: Secondary | ICD-10-CM | POA: Diagnosis not present

## 2023-07-15 DIAGNOSIS — M25561 Pain in right knee: Secondary | ICD-10-CM | POA: Diagnosis not present

## 2023-07-19 DIAGNOSIS — S83511A Sprain of anterior cruciate ligament of right knee, initial encounter: Secondary | ICD-10-CM | POA: Diagnosis not present
